# Patient Record
Sex: Female | Born: 2000 | Hispanic: Yes | Marital: Single | Smoking: Current every day smoker
Health system: Southern US, Community
[De-identification: ages and names within clinical notes are randomized; demographics above are authoritative.]

## PROBLEM LIST (undated history)

## (undated) DIAGNOSIS — F32A Depression, unspecified: Secondary | ICD-10-CM

## (undated) DIAGNOSIS — N200 Calculus of kidney: Secondary | ICD-10-CM

## (undated) DIAGNOSIS — F419 Anxiety disorder, unspecified: Secondary | ICD-10-CM

---
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
## 2021-02-13 ENCOUNTER — Other Ambulatory Visit: Payer: Self-pay

## 2021-02-13 ENCOUNTER — Emergency Department
Admission: EM | Admit: 2021-02-13 | Discharge: 2021-02-13 | Disposition: A | Discharge status: Home / Self Care | Payer: Medicaid Other | Attending: Emergency Medicine | Admitting: Emergency Medicine

## 2021-02-13 ENCOUNTER — Emergency Department: Payer: Medicaid Other

## 2021-02-13 ENCOUNTER — Encounter: Payer: Self-pay | Admitting: Emergency Medicine

## 2021-02-13 DIAGNOSIS — F17210 Nicotine dependence, cigarettes, uncomplicated: Secondary | ICD-10-CM | POA: Insufficient documentation

## 2021-02-13 DIAGNOSIS — R300 Dysuria: Secondary | ICD-10-CM | POA: Insufficient documentation

## 2021-02-13 DIAGNOSIS — R509 Fever, unspecified: Secondary | ICD-10-CM | POA: Diagnosis present

## 2021-02-13 DIAGNOSIS — U071 COVID-19: Secondary | ICD-10-CM | POA: Diagnosis not present

## 2021-02-13 DIAGNOSIS — J069 Acute upper respiratory infection, unspecified: Secondary | ICD-10-CM | POA: Insufficient documentation

## 2021-02-13 HISTORY — DX: Anxiety disorder, unspecified: F41.9

## 2021-02-13 HISTORY — DX: Depression, unspecified: F32.A

## 2021-02-13 LAB — URINALYSIS, ROUTINE W REFLEX MICROSCOPIC
Bilirubin Urine: NEGATIVE
Glucose, UA: NEGATIVE mg/dL
Hgb urine dipstick: NEGATIVE
Ketones, ur: 5 mg/dL — AB
Leukocytes,Ua: NEGATIVE
Nitrite: NEGATIVE
Protein, ur: NEGATIVE mg/dL
Specific Gravity, Urine: 1.025 (ref 1.005–1.030)
pH: 5 (ref 5.0–8.0)

## 2021-02-13 LAB — RESP PANEL BY RT-PCR (FLU A&B, COVID) ARPGX2
Influenza A by PCR: NEGATIVE
Influenza B by PCR: NEGATIVE
SARS Coronavirus 2 by RT PCR: POSITIVE — AB

## 2021-02-13 LAB — POC URINE PREG, ED: Preg Test, Ur: NEGATIVE

## 2021-02-13 NOTE — Discharge Instructions (Signed)
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
## 2021-02-13 NOTE — ED Triage Notes (Signed)
Pt to ED from home c/o congestion, fever, sore throat, ear aches, body aches since the 19th.  States tempt over 100 at home but not sure how high, unknown exposure to others, productive clear cough.  Pt A&Ox4, chest rise even and unlabored, skin WNL and in NAD at this time.
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
## 2022-06-17 IMAGING — CR DG CHEST 2V
2 series · 2 of 2 positions shown · non-contrast
Comparison: None

CLINICAL DATA: Cough.  Fever and sore throat.  Earaches.

EXAM:
CHEST - 2 VIEW

[chest pa]
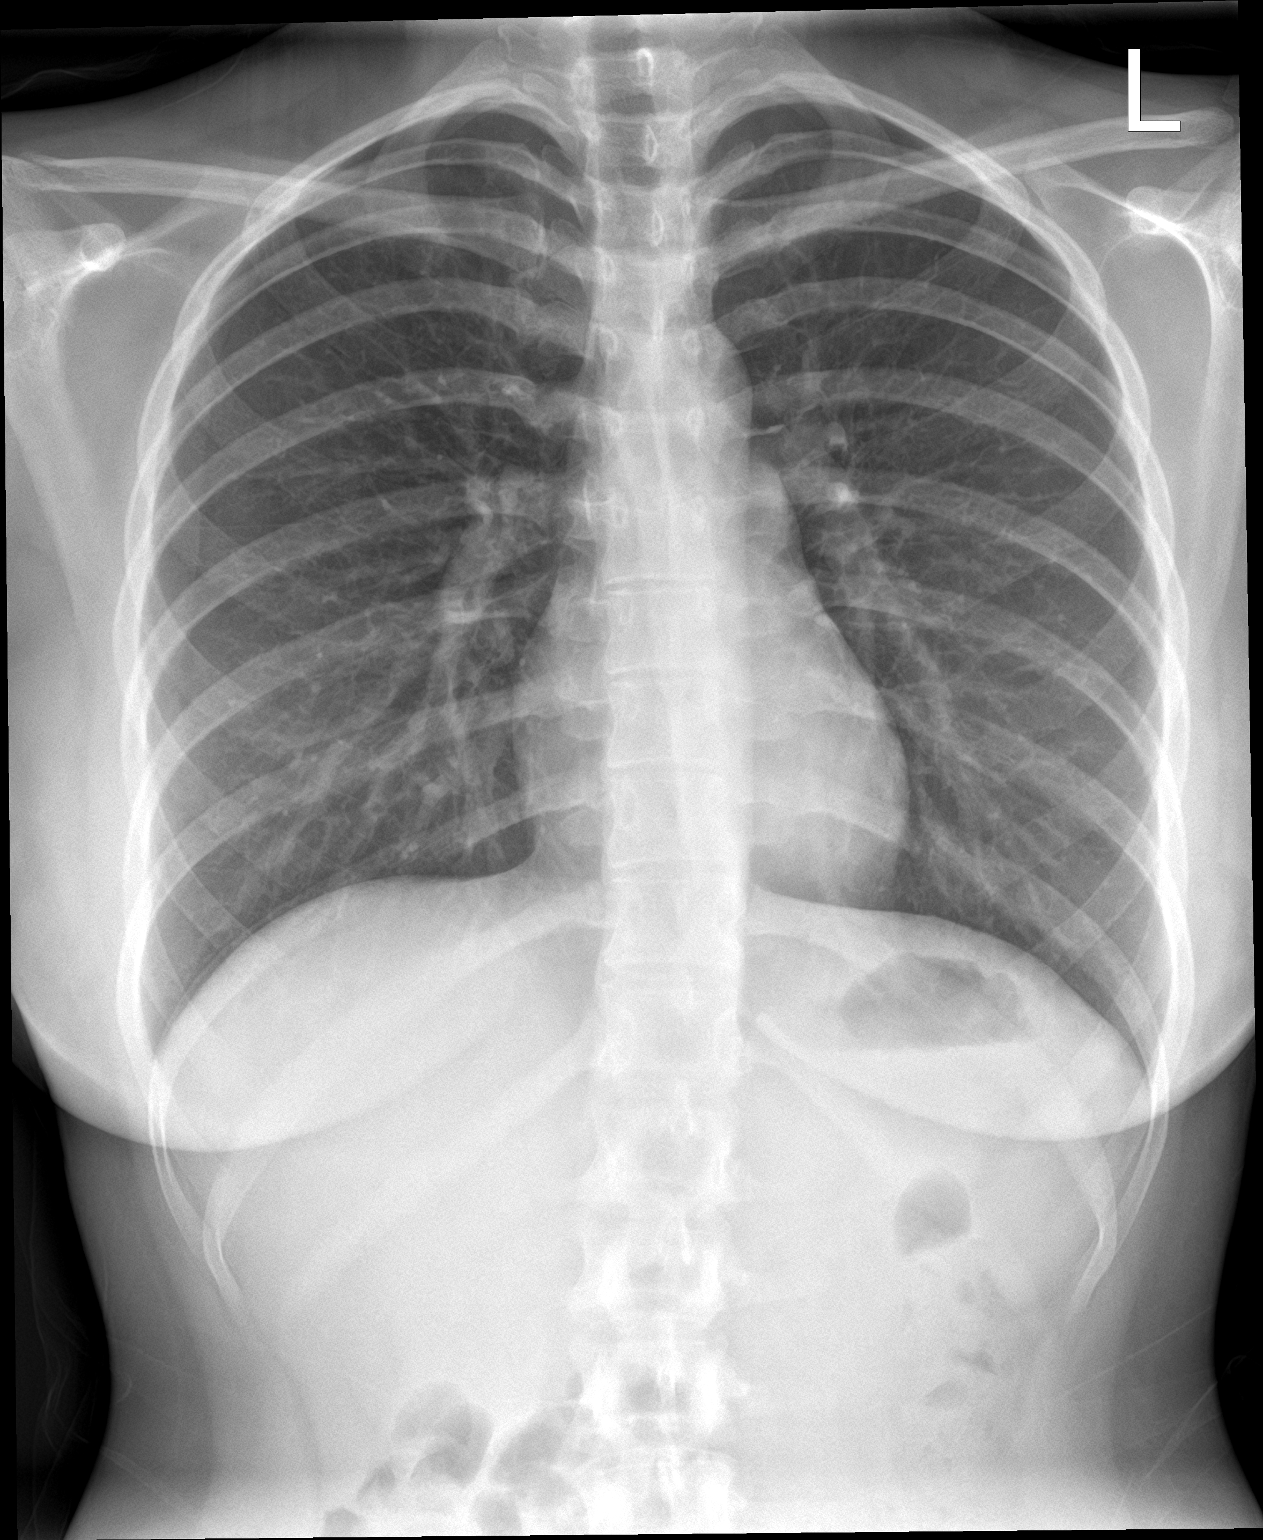

[chest lat]
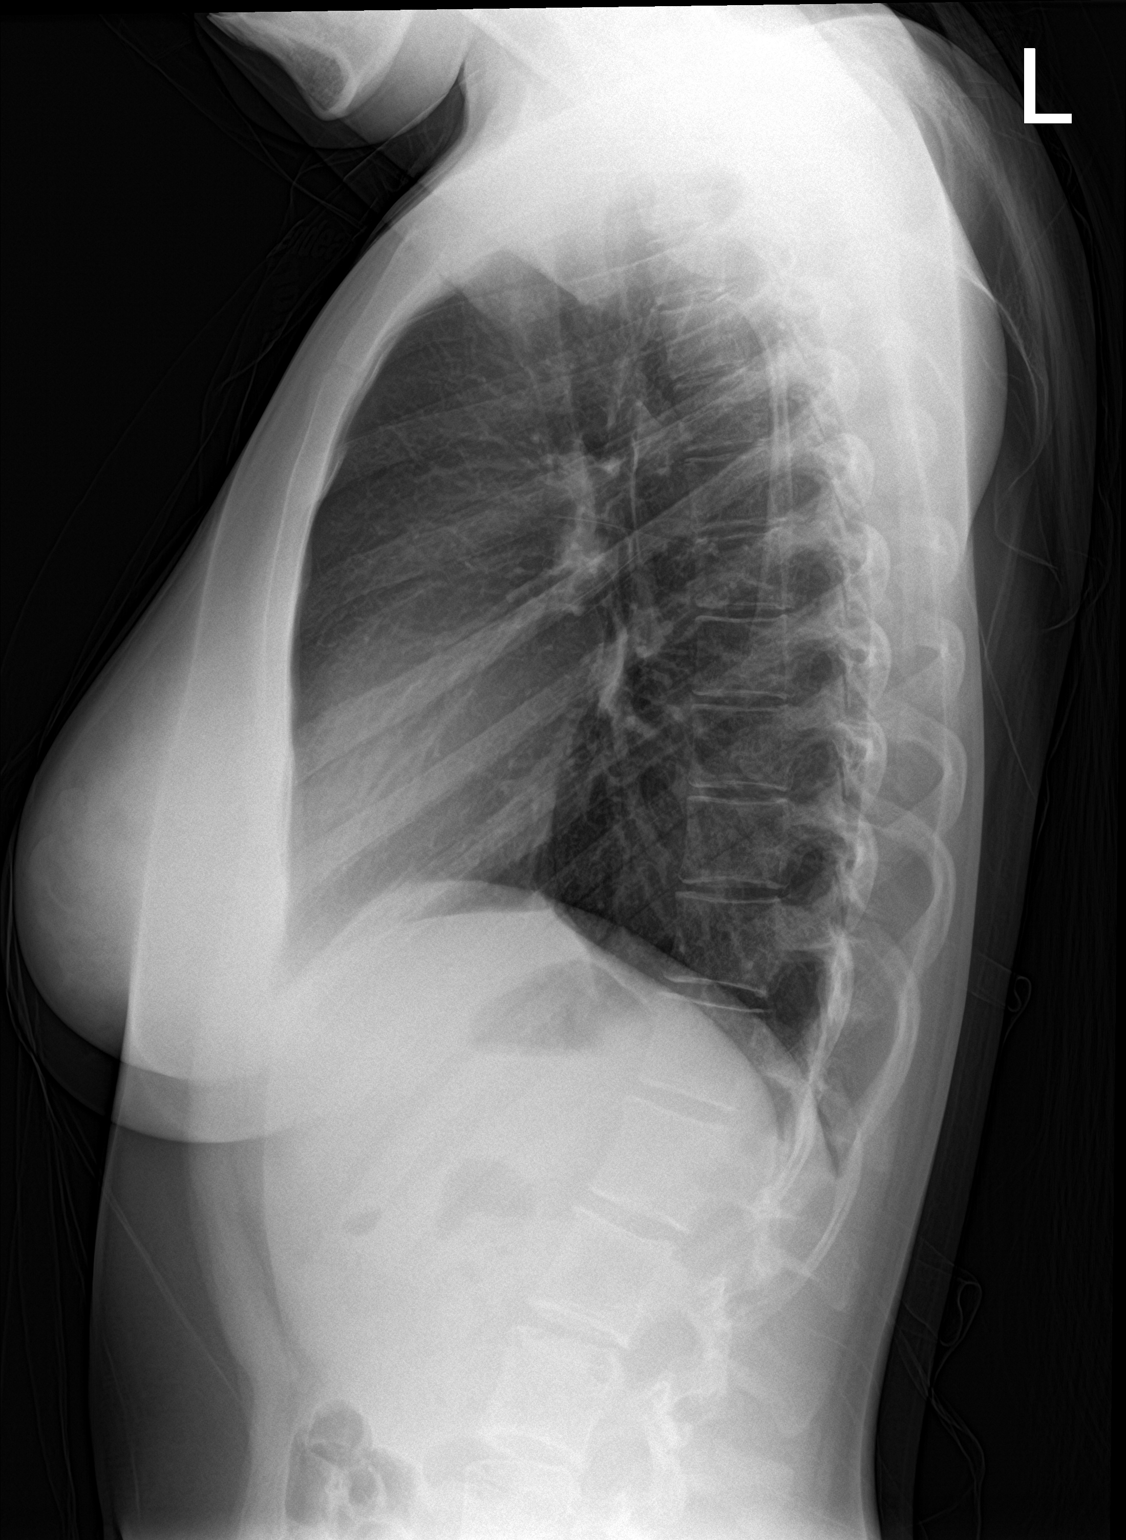

[2 of 2 positions shown; findings below may reference images not displayed]

FINDINGS: The heart size and mediastinal contours are within normal limits.
Both lungs are clear. The visualized skeletal structures are
unremarkable.
IMPRESSION: No active cardiopulmonary disease.
